# Patient Record
Sex: Male | Born: 1990 | Race: Black or African American | Hispanic: No | Marital: Single | Smoking: Current every day smoker
Health system: Southern US, Community
[De-identification: ages and names within clinical notes are randomized; demographics above are authoritative.]

## PROBLEM LIST (undated history)

## (undated) DIAGNOSIS — Z8709 Personal history of other diseases of the respiratory system: Secondary | ICD-10-CM

## (undated) HISTORY — PX: NO PAST SURGERIES: SHX2092

---
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
## 2010-05-01 LAB — URINALYSIS, ROUTINE W REFLEX MICROSCOPIC
Glucose, UA: NEGATIVE mg/dL
Hgb urine dipstick: NEGATIVE
Ketones, ur: NEGATIVE mg/dL
Protein, ur: NEGATIVE mg/dL
Urobilinogen, UA: 0.2 mg/dL (ref 0.0–1.0)

## 2013-08-03 ENCOUNTER — Emergency Department (HOSPITAL_COMMUNITY)
Admission: EM | Admit: 2013-08-03 | Discharge: 2013-08-03 | Disposition: A | Discharge status: Home / Self Care | Payer: No Typology Code available for payment source | Attending: Emergency Medicine | Admitting: Emergency Medicine

## 2013-08-03 ENCOUNTER — Emergency Department (HOSPITAL_COMMUNITY): Payer: No Typology Code available for payment source

## 2013-08-03 ENCOUNTER — Encounter (HOSPITAL_COMMUNITY): Payer: Self-pay | Admitting: Emergency Medicine

## 2013-08-03 DIAGNOSIS — Y92410 Unspecified street and highway as the place of occurrence of the external cause: Secondary | ICD-10-CM | POA: Insufficient documentation

## 2013-08-03 DIAGNOSIS — S39012A Strain of muscle, fascia and tendon of lower back, initial encounter: Secondary | ICD-10-CM

## 2013-08-03 DIAGNOSIS — S139XXA Sprain of joints and ligaments of unspecified parts of neck, initial encounter: Secondary | ICD-10-CM | POA: Insufficient documentation

## 2013-08-03 DIAGNOSIS — Y9389 Activity, other specified: Secondary | ICD-10-CM | POA: Insufficient documentation

## 2013-08-03 DIAGNOSIS — S335XXA Sprain of ligaments of lumbar spine, initial encounter: Secondary | ICD-10-CM | POA: Insufficient documentation

## 2013-08-03 DIAGNOSIS — S161XXA Strain of muscle, fascia and tendon at neck level, initial encounter: Secondary | ICD-10-CM

## 2013-08-03 MED ORDER — TRAMADOL HCL 50 MG PO TABS: 50.0000 mg | ORAL_TABLET | Freq: Four times a day (QID) | ORAL | Status: DC | PRN

## 2013-08-03 MED ORDER — CYCLOBENZAPRINE HCL 10 MG PO TABS: 10.0000 mg | ORAL_TABLET | Freq: Three times a day (TID) | ORAL | Status: DC | PRN
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
## 2013-08-03 NOTE — Discharge Instructions (Signed)
Follow up if not improving
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
## 2015-11-14 ENCOUNTER — Encounter (HOSPITAL_COMMUNITY): Payer: Self-pay | Admitting: Emergency Medicine

## 2015-11-14 ENCOUNTER — Emergency Department (HOSPITAL_COMMUNITY)
Admission: EM | Admit: 2015-11-14 | Discharge: 2015-11-14 | Disposition: A | Discharge status: Home / Self Care | Payer: No Typology Code available for payment source | Attending: Emergency Medicine | Admitting: Emergency Medicine

## 2015-11-14 DIAGNOSIS — F17210 Nicotine dependence, cigarettes, uncomplicated: Secondary | ICD-10-CM | POA: Insufficient documentation

## 2015-11-14 DIAGNOSIS — Y92410 Unspecified street and highway as the place of occurrence of the external cause: Secondary | ICD-10-CM | POA: Insufficient documentation

## 2015-11-14 DIAGNOSIS — Z79899 Other long term (current) drug therapy: Secondary | ICD-10-CM | POA: Insufficient documentation

## 2015-11-14 DIAGNOSIS — T148XXA Other injury of unspecified body region, initial encounter: Secondary | ICD-10-CM

## 2015-11-14 DIAGNOSIS — Y999 Unspecified external cause status: Secondary | ICD-10-CM | POA: Diagnosis not present

## 2015-11-14 DIAGNOSIS — Z791 Long term (current) use of non-steroidal anti-inflammatories (NSAID): Secondary | ICD-10-CM | POA: Insufficient documentation

## 2015-11-14 DIAGNOSIS — Y939 Activity, unspecified: Secondary | ICD-10-CM | POA: Insufficient documentation

## 2015-11-14 DIAGNOSIS — S299XXA Unspecified injury of thorax, initial encounter: Secondary | ICD-10-CM | POA: Diagnosis present

## 2015-11-14 DIAGNOSIS — S29012A Strain of muscle and tendon of back wall of thorax, initial encounter: Secondary | ICD-10-CM | POA: Diagnosis not present

## 2015-11-14 MED ORDER — IBUPROFEN 600 MG PO TABS: 600.0000 mg | ORAL_TABLET | Freq: Four times a day (QID) | ORAL | 0 refills | Status: DC | PRN

## 2015-11-14 MED ORDER — CYCLOBENZAPRINE HCL 10 MG PO TABS: 10.0000 mg | ORAL_TABLET | Freq: Three times a day (TID) | ORAL | 0 refills | Status: DC
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
## 2015-11-14 NOTE — Discharge Instructions (Signed)
Your examination is consistent with muscle strain involving the upper back and shoulder areas. Your vital signs are well within normal limits. Your oxygen level is 100% on room air, within normal limits. Please use Flexeril 3 times daily for spasm pain. Please use ibuprofen every 6 hours for pain and soreness. Flexeril may cause drowsiness, please do not drive, drink alcohol, operate machinery, or participate in activities requiring concentration when taking this medication. Please see Dr. Romeo AppleHarrison for orthopedic evaluation if not improving.

## 2016-03-21 ENCOUNTER — Other Ambulatory Visit: Payer: Self-pay | Admitting: Advanced Practice Midwife

## 2016-03-21 MED ORDER — AZITHROMYCIN 500 MG PO TABS: 1000.0000 mg | ORAL_TABLET | Freq: Once | ORAL | 0 refills | Status: AC

## 2016-03-21 NOTE — Progress Notes (Signed)
Chase Ward [birth year] + chlamydia. Rx azithromycin 1gm PO

## 2018-01-01 ENCOUNTER — Emergency Department (HOSPITAL_COMMUNITY): Payer: No Typology Code available for payment source

## 2018-01-01 ENCOUNTER — Encounter (HOSPITAL_COMMUNITY): Payer: Self-pay

## 2018-01-01 ENCOUNTER — Emergency Department (HOSPITAL_COMMUNITY)
Admission: EM | Admit: 2018-01-01 | Discharge: 2018-01-01 | Disposition: A | Discharge status: Home / Self Care | Payer: No Typology Code available for payment source | Attending: Emergency Medicine | Admitting: Emergency Medicine

## 2018-01-01 ENCOUNTER — Other Ambulatory Visit: Payer: Self-pay

## 2018-01-01 DIAGNOSIS — Y999 Unspecified external cause status: Secondary | ICD-10-CM | POA: Diagnosis not present

## 2018-01-01 DIAGNOSIS — S199XXA Unspecified injury of neck, initial encounter: Secondary | ICD-10-CM | POA: Diagnosis not present

## 2018-01-01 DIAGNOSIS — Y9389 Activity, other specified: Secondary | ICD-10-CM | POA: Insufficient documentation

## 2018-01-01 DIAGNOSIS — T07XXXA Unspecified multiple injuries, initial encounter: Secondary | ICD-10-CM | POA: Diagnosis not present

## 2018-01-01 DIAGNOSIS — S3992XA Unspecified injury of lower back, initial encounter: Secondary | ICD-10-CM

## 2018-01-01 DIAGNOSIS — F17210 Nicotine dependence, cigarettes, uncomplicated: Secondary | ICD-10-CM | POA: Diagnosis not present

## 2018-01-01 DIAGNOSIS — Y92410 Unspecified street and highway as the place of occurrence of the external cause: Secondary | ICD-10-CM | POA: Diagnosis not present

## 2018-01-01 MED ORDER — HYDROCODONE-ACETAMINOPHEN 5-325 MG PO TABS: 1.0000 | ORAL_TABLET | ORAL | 0 refills | Status: DC | PRN

## 2018-01-01 MED ORDER — HYDROCODONE-ACETAMINOPHEN 5-325 MG PO TABS
1.0000 | ORAL_TABLET | Freq: Once | ORAL | Status: AC
  Administered 2018-01-01: 1 via ORAL
  Filled 2018-01-01: qty 1
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
## 2018-01-01 NOTE — Discharge Instructions (Addendum)
Use ice on sore spots 3 or 4 times a day for 2 days, after that use heat.  Use ibuprofen 400 mg 3 times a day with meals for pain.  Use the narcotic pain reliever, if needed.  Follow-up with the doctor of your choice if not better in 4 or 5 days.

## 2018-01-01 NOTE — ED Notes (Signed)
Patient transported to X-ray 

## 2018-01-18 ENCOUNTER — Encounter (HOSPITAL_COMMUNITY): Payer: Self-pay

## 2018-01-18 ENCOUNTER — Emergency Department (HOSPITAL_COMMUNITY)
Admission: EM | Admit: 2018-01-18 | Discharge: 2018-01-18 | Disposition: A | Discharge status: Home / Self Care | Payer: No Typology Code available for payment source | Attending: Emergency Medicine | Admitting: Emergency Medicine

## 2018-01-18 ENCOUNTER — Other Ambulatory Visit: Payer: Self-pay

## 2018-01-18 DIAGNOSIS — Z5321 Procedure and treatment not carried out due to patient leaving prior to being seen by health care provider: Secondary | ICD-10-CM | POA: Insufficient documentation

## 2018-01-18 DIAGNOSIS — R110 Nausea: Secondary | ICD-10-CM | POA: Insufficient documentation
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
## 2018-01-18 NOTE — ED Notes (Signed)
Pt not in waiting room at this time.

## 2018-01-18 NOTE — ED Notes (Signed)
Called pt not in waiting room

## 2018-04-09 ENCOUNTER — Encounter (HOSPITAL_COMMUNITY): Payer: Self-pay | Admitting: Emergency Medicine

## 2018-04-09 ENCOUNTER — Other Ambulatory Visit (HOSPITAL_COMMUNITY): Payer: Self-pay

## 2018-04-09 ENCOUNTER — Emergency Department (HOSPITAL_COMMUNITY)
Admission: EM | Admit: 2018-04-09 | Discharge: 2018-04-10 | Disposition: A | Discharge status: Home / Self Care | Payer: Self-pay | Attending: Emergency Medicine | Admitting: Emergency Medicine

## 2018-04-09 ENCOUNTER — Emergency Department (HOSPITAL_COMMUNITY): Payer: Self-pay

## 2018-04-09 DIAGNOSIS — W2105XA Struck by basketball, initial encounter: Secondary | ICD-10-CM | POA: Insufficient documentation

## 2018-04-09 DIAGNOSIS — S62606A Fracture of unspecified phalanx of right little finger, initial encounter for closed fracture: Secondary | ICD-10-CM

## 2018-04-09 DIAGNOSIS — Y929 Unspecified place or not applicable: Secondary | ICD-10-CM | POA: Insufficient documentation

## 2018-04-09 DIAGNOSIS — Y9367 Activity, basketball: Secondary | ICD-10-CM | POA: Insufficient documentation

## 2018-04-09 DIAGNOSIS — Y999 Unspecified external cause status: Secondary | ICD-10-CM | POA: Insufficient documentation

## 2018-04-09 DIAGNOSIS — S62616A Displaced fracture of proximal phalanx of right little finger, initial encounter for closed fracture: Secondary | ICD-10-CM | POA: Insufficient documentation

## 2018-04-09 DIAGNOSIS — F17210 Nicotine dependence, cigarettes, uncomplicated: Secondary | ICD-10-CM | POA: Insufficient documentation

## 2018-04-09 MED ORDER — IBUPROFEN 800 MG PO TABS
800.0000 mg | ORAL_TABLET | Freq: Once | ORAL | Status: AC
  Administered 2018-04-10: 800 mg via ORAL
  Filled 2018-04-09: qty 1

## 2018-04-09 NOTE — ED Triage Notes (Signed)
Pt was playing basketball, injured his right pinkie finger, swollen and painful.

## 2018-04-09 NOTE — ED Provider Notes (Signed)
[facility] EMERGENCY DEPARTMENT Provider Note   CSN: [id number] Arrival date & time: 04/09/18  2032    History   Chief Complaint Chief Complaint  Patient presents with   Finger Injury    HPI Chase Ward is a 28 y.o. male.     The history is provided by the patient and medical records.     28 year old male presenting to the ED with right fifth finger injury.  States he was playing basketball and jammed his finger up against the ball.  Has pain along proximal aspect of right fifth digit.  He denies any other injuries.  He is right-hand dominant.  No intervention tried prior to arrival.  History reviewed. No pertinent past medical history.  There are no active problems to display for this patient.   History reviewed. No pertinent surgical history.      Home Medications    Prior to Admission medications   Medication Sig Start Date End Date Taking? Authorizing Provider  cyclobenzaprine (FLEXERIL) 10 MG tablet Take 1 tablet (10 mg total) by mouth 3 (three) times daily. Patient not taking: Reported on 01/01/2018 11/14/15   Ivery Quale, PA-C  HYDROcodone-acetaminophen [facility]) 5-325 MG tablet Take 1-2 tablets by mouth every 4 (four) hours as needed for moderate pain. 01/01/18   Mancel Bale, MD  ibuprofen (ADVIL,MOTRIN) 600 MG tablet Take 1 tablet (600 mg total) by mouth every 6 (six) hours as needed. Patient not taking: Reported on 01/01/2018 11/14/15   Ivery Quale, PA-C  traMADol (ULTRAM) 50 MG tablet Take 1 tablet (50 mg total) by mouth every 6 (six) hours as needed. Patient not taking: Reported on 01/01/2018 08/03/13   Bethann Berkshire, MD    Family History No family history on file.  Social History Social History   Tobacco Use   Smoking status: Current Every Day Smoker    Packs/day: 0.50    Types: Cigarettes   Smokeless tobacco: Never Used  Substance Use Topics   Alcohol use: No   Drug use: No     Allergies   Bee venom  and Penicillins   Review of Systems Review of Systems  Musculoskeletal: Positive for arthralgias.  All other systems reviewed and are negative.    Physical Exam Updated Vital Signs BP 121/79 (BP Location: Left Arm)    Pulse 77    Temp 97.6 F (36.4 C) (Oral)    Resp 19    Ht 5\' 7"  (1.702 m)    Wt 70.3 kg    SpO2 98%    BMI 24.28 kg/m   Physical Exam Vitals signs and nursing note reviewed.  Constitutional:      Appearance: He is well-developed.  HENT:     Head: Normocephalic and atraumatic.  Eyes:     Conjunctiva/sclera: Conjunctivae normal.     Pupils: Pupils are equal, round, and reactive to light.  Neck:     Musculoskeletal: Normal range of motion.  Cardiovascular:     Rate and Rhythm: Normal rate and regular rhythm.     Heart sounds: Normal heart sounds.  Pulmonary:     Effort: Pulmonary effort is normal.     Breath sounds: Normal breath sounds.  Abdominal:     General: Bowel sounds are normal.     Palpations: Abdomen is soft.  Musculoskeletal: Normal range of motion.     Comments: Right 5th digit with swelling/bruising over right 5th digit; there is swelling noted; limited flexion/extension but able to range at the MCP joint;  no tenderness or deformity noted to the 5th metacarpal; normal radial pulse and cap refill; distal sensation intact  Skin:    General: Skin is warm and dry.  Neurological:     Mental Status: He is alert and oriented to person, place, and time.      ED Treatments / Results  Labs (all labs ordered are listed, but only abnormal results are displayed) Labs Reviewed - No data to display  EKG None  Radiology Dg Hand Complete Right  Result Date: 04/09/2018 CLINICAL DATA:  Deformity after basketball EXAM: RIGHT HAND - COMPLETE 3+ VIEW COMPARISON:  None. FINDINGS: Acute fracture mid to distal shaft of the fifth proximal phalanx without definitive articular extension. Moderate ulnar and dorsal angulation of distal fracture fragment. Just under  1 bone with dorsal displacement distal fracture fragment with mild overriding. No subluxation IMPRESSION: Acute displaced and angulated fracture involving the mid to distal fifth proximal phalanx Electronically Signed   By: Jasmine Pang M.D.   On: 04/09/2018 21:44    Procedures Procedures (including critical care time)  Medications Ordered in ED Medications - No data to display   Initial Impression / Assessment and Plan / ED Course  I have reviewed the triage vital signs and the nursing notes.  Pertinent labs & imaging results that were available during my care of the patient were reviewed by me and considered in my medical decision making (see chart for details).  28 year old male here with right fifth finger injury while playing basketball, jammed his finger against the ball.  Has swelling and bruising over the right fifth digit, proximal phalanx, there is swelling noted.  Limited range of motion of the IP joints, but able to maintain normal range of motion of the MCP joint.  There is no bruising, swelling, or deformity along the fifth metacarpal.  X-ray confirming displaced and angulated fracture of the proximal fifth digit.  Patient was placed in static splint.  Will start anti-inflammatories.  Given hand surgery follow-up.  Can return here for any new or acute changes.  Final Clinical Impressions(s) / ED Diagnoses   Final diagnoses:  Fracture of unspecified phalanx of right little finger, initial encounter for closed fracture    ED Discharge Orders         Ordered    ibuprofen (ADVIL,MOTRIN) 800 MG tablet  3 times daily     04/10/18 0035           Garlon Hatchet, PA-C 04/10/18 [place]    Shaune Pollack, MD 04/11/18 1900

## 2018-04-10 MED ORDER — IBUPROFEN 800 MG PO TABS: 800.0000 mg | ORAL_TABLET | Freq: Three times a day (TID) | ORAL | 0 refills | Status: DC

## 2018-04-10 NOTE — Discharge Instructions (Signed)
Take the prescribed medication as directed.  Can alternate with tylenol if you like.  Leave splint in place. Follow-up with Dr. Merlyn Lot-- call his office for appt. Return to the ED for new or worsening symptoms.

## 2018-04-21 ENCOUNTER — Other Ambulatory Visit: Payer: Self-pay | Admitting: Orthopedic Surgery

## 2018-04-21 ENCOUNTER — Other Ambulatory Visit: Payer: Self-pay

## 2018-04-21 ENCOUNTER — Encounter (HOSPITAL_BASED_OUTPATIENT_CLINIC_OR_DEPARTMENT_OTHER): Payer: Self-pay

## 2018-04-23 ENCOUNTER — Encounter (HOSPITAL_BASED_OUTPATIENT_CLINIC_OR_DEPARTMENT_OTHER): Payer: Self-pay | Admitting: Certified Registered"

## 2018-04-23 ENCOUNTER — Ambulatory Visit (HOSPITAL_BASED_OUTPATIENT_CLINIC_OR_DEPARTMENT_OTHER): Admission: RE | Admit: 2018-04-23 | Payer: Self-pay | Source: Home / Self Care | Admitting: Orthopedic Surgery

## 2018-04-23 HISTORY — DX: Personal history of other diseases of the respiratory system: Z87.09

## 2018-04-23 SURGERY — CLOSED REDUCTION, FINGER, WITH PERCUTANEOUS PINNING
Anesthesia: Choice | Laterality: Right

## 2018-04-24 ENCOUNTER — Other Ambulatory Visit: Payer: Self-pay | Admitting: Orthopedic Surgery

## 2018-04-30 ENCOUNTER — Ambulatory Visit (HOSPITAL_BASED_OUTPATIENT_CLINIC_OR_DEPARTMENT_OTHER): Admission: RE | Admit: 2018-04-30 | Payer: Self-pay | Source: Home / Self Care | Admitting: Orthopedic Surgery

## 2018-04-30 ENCOUNTER — Encounter (HOSPITAL_BASED_OUTPATIENT_CLINIC_OR_DEPARTMENT_OTHER): Admission: RE | Payer: Self-pay | Source: Home / Self Care

## 2018-04-30 SURGERY — CLOSED REDUCTION, FINGER, WITH PERCUTANEOUS PINNING
Anesthesia: Choice | Laterality: Right

## 2018-08-13 ENCOUNTER — Other Ambulatory Visit: Payer: Self-pay

## 2018-08-13 DIAGNOSIS — Z20822 Contact with and (suspected) exposure to covid-19: Secondary | ICD-10-CM

## 2018-08-16 LAB — NOVEL CORONAVIRUS, NAA: SARS-CoV-2, NAA: NOT DETECTED

## 2018-08-17 ENCOUNTER — Telehealth: Payer: Self-pay | Admitting: General Practice

## 2018-08-17 NOTE — Telephone Encounter (Signed)
Pt called to get COVID results, informed him they are negative.

## 2019-05-25 ENCOUNTER — Other Ambulatory Visit: Payer: Self-pay

## 2019-10-06 ENCOUNTER — Other Ambulatory Visit: Payer: Medicaid Other

## 2019-10-06 ENCOUNTER — Other Ambulatory Visit: Payer: Self-pay

## 2019-10-06 DIAGNOSIS — Z20822 Contact with and (suspected) exposure to covid-19: Secondary | ICD-10-CM

## 2019-10-08 LAB — NOVEL CORONAVIRUS, NAA: SARS-CoV-2, NAA: NOT DETECTED

## 2019-10-08 LAB — SARS-COV-2, NAA 2 DAY TAT
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
## 2022-03-27 ENCOUNTER — Ambulatory Visit
Admission: EM | Admit: 2022-03-27 | Discharge: 2022-03-27 | Disposition: A | Discharge status: Home / Self Care | Payer: Medicaid Other | Attending: Nurse Practitioner | Admitting: Nurse Practitioner

## 2022-03-27 ENCOUNTER — Encounter: Payer: Self-pay | Admitting: Emergency Medicine

## 2022-03-27 DIAGNOSIS — Z1152 Encounter for screening for COVID-19: Secondary | ICD-10-CM | POA: Insufficient documentation

## 2022-03-27 DIAGNOSIS — R1084 Generalized abdominal pain: Secondary | ICD-10-CM | POA: Diagnosis not present

## 2022-03-27 DIAGNOSIS — R110 Nausea: Secondary | ICD-10-CM | POA: Diagnosis not present

## 2022-03-27 LAB — POCT INFLUENZA A/B
Influenza A, POC: NEGATIVE
Influenza B, POC: NEGATIVE

## 2022-03-27 LAB — POCT RAPID STREP A (OFFICE): Rapid Strep A Screen: NEGATIVE

## 2022-03-27 MED ORDER — ONDANSETRON 4 MG PO TBDP: 4.0000 mg | ORAL_TABLET | Freq: Three times a day (TID) | ORAL | 0 refills | Status: AC | PRN

## 2022-03-27 NOTE — ED Triage Notes (Signed)
Headache, diarrhea, ABD Pain, back pain, sore throat since Sunday.

## 2022-03-27 NOTE — ED Provider Notes (Signed)
[facility]    CSN: [id number] Arrival date & time: 03/27/22  1050      History   Chief Complaint No chief complaint on file.   HPI Chase Ward is a 32 y.o. male.   Patient presents today with 3 day history of sore throat.  Also having body aches, chills, headache, abdominal pain, nausea, diarrhea, decreased appetite, and fatigue.  Patient denies cough, congestion, fever, congested cough, dry cough, shortness of breath, chest pain with coughing, wheezing, chest tightness, chest congestion, nasal congestion, runny nose, post nasal drainage, sinus pressure, ear pain, vomiting, loss of taste, loss of smell, and rash.  Reports he has been able to eat normally, however he is having to force himself to eat because he does not feel like it.  Has taken nothing so far for symptoms.  Has been in multiple public places where people were coughing, no known specific exposures, however.    Past Medical History:  Diagnosis Date   History of asthma    as child    There are no problems to display for this patient.   Past Surgical History:  Procedure Laterality Date   NO PAST SURGERIES         Home Medications    Prior to Admission medications   Medication Sig Start Date End Date Taking? Authorizing Provider  ondansetron (ZOFRAN-ODT) 4 MG disintegrating tablet Take 1 tablet (4 mg total) by mouth every 8 (eight) hours as needed for nausea or vomiting. 03/27/22  Yes Eulogio Bear, NP    Family History History reviewed. No pertinent family history.  Social History Social History   Tobacco Use   Smoking status: Every Day    Packs/day: 0.50    Types: Cigarettes   Smokeless tobacco: Never  Vaping Use   Vaping Use: Never used  Substance Use Topics   Alcohol use: No   Drug use: Yes    Types: Marijuana    Comment: 3 times per week     Allergies   Bee venom   Review of Systems Review of Systems Per HPI  Physical Exam Triage Vital Signs ED Triage  Vitals  Enc Vitals Group     BP 03/27/22 1155 (!) 175/92     Pulse Rate 03/27/22 1155 71     Resp 03/27/22 1155 18     Temp 03/27/22 1155 98.7 F (37.1 C)     Temp Source 03/27/22 1155 Oral     SpO2 03/27/22 1155 97 %     Weight --      Height --      Head Circumference --      Peak Flow --      Pain Score 03/27/22 1156 6     Pain Loc --      Pain Edu? --      Excl. in [place]? --    No data found.  Updated Vital Signs BP (!) 175/92 (BP Location: Right Arm)   Pulse 71   Temp 98.7 F (37.1 C) (Oral)   Resp 18   SpO2 97%   Visual Acuity Right Eye Distance:   Left Eye Distance:   Bilateral Distance:    Right Eye Near:   Left Eye Near:    Bilateral Near:     Physical Exam Vitals and nursing note reviewed.  Constitutional:      General: He is not in acute distress.    Appearance: Normal appearance. He is not ill-appearing or toxic-appearing.  HENT:  Head: Normocephalic and atraumatic.     Right Ear: Tympanic membrane, ear canal and external ear normal.     Left Ear: Tympanic membrane, ear canal and external ear normal.     Nose: No congestion or rhinorrhea.     Mouth/Throat:     Mouth: Mucous membranes are moist.     Pharynx: Oropharynx is clear. No oropharyngeal exudate or posterior oropharyngeal erythema.  Eyes:     General: No scleral icterus.    Extraocular Movements: Extraocular movements intact.  Cardiovascular:     Rate and Rhythm: Normal rate and regular rhythm.  Pulmonary:     Effort: Pulmonary effort is normal. No respiratory distress.     Breath sounds: Normal breath sounds. No wheezing, rhonchi or rales.  Abdominal:     General: Abdomen is flat. Bowel sounds are normal. There is no distension.     Palpations: Abdomen is soft.     Tenderness: There is no abdominal tenderness. There is no right CVA tenderness, left CVA tenderness, guarding or rebound. Negative signs include Murphy's sign and McBurney's sign.  Musculoskeletal:     Cervical back: Normal  range of motion and neck supple.  Lymphadenopathy:     Cervical: No cervical adenopathy.  Skin:    General: Skin is warm and dry.     Coloration: Skin is not jaundiced or pale.     Findings: No erythema or rash.  Neurological:     Mental Status: He is alert and oriented to person, place, and time.  Psychiatric:        Mood and Affect: Mood normal.        Behavior: Behavior normal.      UC Treatments / Results  Labs (all labs ordered are listed, but only abnormal results are displayed) Labs Reviewed  SARS CORONAVIRUS 2 (TAT 6-24 HRS)  POCT RAPID STREP A (OFFICE)  POCT INFLUENZA A/B    EKG   Radiology No results found.  Procedures Procedures (including critical care time)  Medications Ordered in UC Medications - No data to display  Initial Impression / Assessment and Plan / UC Course  I have reviewed the triage vital signs and the nursing notes.  Pertinent labs & imaging results that were available during my care of the patient were reviewed by me and considered in my medical decision making (see chart for details).   Patient is well-appearing, afebrile, not tachycardic, not tachypneic, oxygenating well on room air.  He is mildly hypertensive today in triage, however asymptomatic.  1. Generalized abdominal pain 2. Nausea without vomiting 3. Encounter for screening for COVID-19 Suspect viral illness Rapid strep test negative; influenza A and B negative COVID-19 test is pending Vital signs and examination today are reassuring It sounds like symptoms are improving Start antiemetic, continue to push fluids and hydration Strict ER and return precautions discussed Note given for work  The patient was given the opportunity to ask questions.  All questions answered to their satisfaction.  The patient is in agreement to this plan.    Final Clinical Impressions(s) / UC Diagnoses   Final diagnoses:  Generalized abdominal pain  Nausea without vomiting  Encounter for  screening for COVID-19     Discharge Instructions      As we discussed, it sounds like you may have a viral illness.  This should improve over the next few days.  We have tested you for COVID-19 today and you will see the results in [place].  The strep throat and  influenza tests today are negative.   To help with the nausea, I have sent zofran to your pharmacy.  Continue making sure you are drinking plenty of water and/or sugar free electrolyte drinks.  Continue the Motrin for headache; you can also take Tylenol [phone] mg every 6 hours as needed.     ED Prescriptions     Medication Sig Dispense Auth. Provider   ondansetron (ZOFRAN-ODT) 4 MG disintegrating tablet Take 1 tablet (4 mg total) by mouth every 8 (eight) hours as needed for nausea or vomiting. 20 tablet Eulogio Bear, NP      PDMP not reviewed this encounter.   Eulogio Bear, NP 03/27/22 [phone]

## 2022-03-27 NOTE — Discharge Instructions (Signed)
As we discussed, it sounds like you may have a viral illness.  This should improve over the next few days.  We have tested you for COVID-19 today and you will see the results in [place].  The strep throat and influenza tests today are negative.   To help with the nausea, I have sent zofran to your pharmacy.  Continue making sure you are drinking plenty of water and/or sugar free electrolyte drinks.  Continue the Motrin for headache; you can also take Tylenol [phone] mg every 6 hours as needed.

## 2022-03-28 LAB — SARS CORONAVIRUS 2 (TAT 6-24 HRS): SARS Coronavirus 2: NEGATIVE
